# Patient Record
Sex: Male | Born: 1985 | Race: White | Hispanic: No | Smoking: Current every day smoker
Health system: Southern US, Community
[De-identification: ages and names within clinical notes are randomized; demographics above are authoritative.]

## PROBLEM LIST (undated history)

## (undated) DIAGNOSIS — I10 Essential (primary) hypertension: Secondary | ICD-10-CM

## (undated) DIAGNOSIS — F209 Schizophrenia, unspecified: Secondary | ICD-10-CM

## (undated) HISTORY — PX: PARTIAL COLECTOMY: SHX5273

## (undated) HISTORY — PX: OTHER SURGICAL HISTORY: SHX169

## (undated) HISTORY — PX: KNEE SURGERY: SHX244

---

## 2006-08-04 ENCOUNTER — Emergency Department (HOSPITAL_COMMUNITY): Admission: EM | Admit: 2006-08-04 | Discharge: 2006-08-04 | Payer: Self-pay | Admitting: Emergency Medicine

## 2007-02-11 ENCOUNTER — Emergency Department (HOSPITAL_COMMUNITY): Admission: EM | Admit: 2007-02-11 | Discharge: 2007-02-12 | Payer: Self-pay | Admitting: Emergency Medicine

## 2007-03-03 ENCOUNTER — Emergency Department (HOSPITAL_COMMUNITY): Admission: EM | Admit: 2007-03-03 | Discharge: 2007-03-03 | Payer: Self-pay | Admitting: Emergency Medicine

## 2010-09-11 ENCOUNTER — Emergency Department (HOSPITAL_COMMUNITY)
Admission: EM | Admit: 2010-09-11 | Discharge: 2010-09-12 | Discharge status: Against Medical Advice | Payer: Self-pay | Attending: Emergency Medicine | Admitting: Emergency Medicine

## 2010-09-11 ENCOUNTER — Emergency Department (HOSPITAL_COMMUNITY): Payer: Self-pay

## 2010-09-11 DIAGNOSIS — R042 Hemoptysis: Secondary | ICD-10-CM | POA: Insufficient documentation

## 2010-09-11 DIAGNOSIS — R0989 Other specified symptoms and signs involving the circulatory and respiratory systems: Secondary | ICD-10-CM | POA: Insufficient documentation

## 2010-09-11 DIAGNOSIS — R059 Cough, unspecified: Secondary | ICD-10-CM | POA: Insufficient documentation

## 2010-09-11 DIAGNOSIS — R079 Chest pain, unspecified: Secondary | ICD-10-CM | POA: Insufficient documentation

## 2010-09-11 DIAGNOSIS — R0609 Other forms of dyspnea: Secondary | ICD-10-CM | POA: Insufficient documentation

## 2010-09-11 DIAGNOSIS — R1110 Vomiting, unspecified: Secondary | ICD-10-CM | POA: Insufficient documentation

## 2010-09-11 DIAGNOSIS — R05 Cough: Secondary | ICD-10-CM | POA: Insufficient documentation

## 2010-09-11 LAB — CBC
Hemoglobin: 17 g/dL (ref 13.0–17.0)
MCH: 32.8 pg (ref 26.0–34.0)
MCV: 92.1 fL (ref 78.0–100.0)
RBC: 5.19 MIL/uL (ref 4.22–5.81)

## 2010-09-11 LAB — DIFFERENTIAL
Lymphs Abs: 3.2 10*3/uL (ref 0.7–4.0)
Monocytes Relative: 9 % (ref 3–12)
Neutro Abs: 4.6 10*3/uL (ref 1.7–7.7)
Neutrophils Relative %: 52 % (ref 43–77)

## 2010-09-11 LAB — URINALYSIS, ROUTINE W REFLEX MICROSCOPIC
Ketones, ur: 15 mg/dL — AB
Urine Glucose, Fasting: NEGATIVE mg/dL
pH: 5.5 (ref 5.0–8.0)

## 2010-09-11 LAB — BASIC METABOLIC PANEL
BUN: 6 mg/dL (ref 6–23)
Chloride: 105 mEq/L (ref 96–112)
Potassium: 3.8 mEq/L (ref 3.5–5.1)
Sodium: 139 mEq/L (ref 135–145)

## 2010-09-12 ENCOUNTER — Emergency Department (HOSPITAL_COMMUNITY): Payer: Self-pay

## 2011-04-25 LAB — URINALYSIS, ROUTINE W REFLEX MICROSCOPIC
Glucose, UA: NEGATIVE
Ketones, ur: NEGATIVE
Specific Gravity, Urine: 1.02
pH: 6.5

## 2011-04-25 LAB — CBC
HCT: 47.5
Hemoglobin: 16.3
MCV: 87.6
RBC: 5.42
WBC: 8.2

## 2011-04-25 LAB — DIFFERENTIAL
Eosinophils Absolute: 0.1
Eosinophils Relative: 1
Lymphocytes Relative: 20
Lymphs Abs: 1.6
Monocytes Relative: 12 — ABNORMAL HIGH

## 2011-04-25 LAB — BASIC METABOLIC PANEL
Chloride: 103
GFR calc Af Amer: >60
GFR calc non Af Amer: >60
Potassium: 3.8
Sodium: 136

## 2011-04-28 LAB — URINALYSIS, ROUTINE W REFLEX MICROSCOPIC
Glucose, UA: NEGATIVE
Hgb urine dipstick: NEGATIVE
Ketones, ur: NEGATIVE
Protein, ur: NEGATIVE
Urobilinogen, UA: 0.2

## 2011-04-28 LAB — CBC
HCT: 46.7
MCHC: 34.3
MCV: 87.1
Platelets: 246

## 2011-04-28 LAB — DIFFERENTIAL
Basophils Relative: 0
Eosinophils Absolute: 0.2
Eosinophils Relative: 3
Lymphs Abs: 2.2
Monocytes Relative: 7
Neutrophils Relative %: 64

## 2011-04-28 LAB — RAPID URINE DRUG SCREEN, HOSP PERFORMED
Amphetamines: NOT DETECTED
Barbiturates: NOT DETECTED
Benzodiazepines: NOT DETECTED

## 2011-08-02 ENCOUNTER — Emergency Department (HOSPITAL_COMMUNITY)
Admission: EM | Admit: 2011-08-02 | Discharge: 2011-08-03 | Disposition: A | Discharge status: Home / Self Care | Payer: Self-pay | Attending: Emergency Medicine | Admitting: Emergency Medicine

## 2011-08-02 ENCOUNTER — Encounter (HOSPITAL_COMMUNITY): Payer: Self-pay

## 2011-08-02 ENCOUNTER — Emergency Department (HOSPITAL_COMMUNITY): Payer: Self-pay

## 2011-08-02 DIAGNOSIS — F17200 Nicotine dependence, unspecified, uncomplicated: Secondary | ICD-10-CM | POA: Insufficient documentation

## 2011-08-02 DIAGNOSIS — F1010 Alcohol abuse, uncomplicated: Secondary | ICD-10-CM | POA: Insufficient documentation

## 2011-08-02 DIAGNOSIS — K2921 Alcoholic gastritis with bleeding: Secondary | ICD-10-CM | POA: Insufficient documentation

## 2011-08-02 LAB — DIFFERENTIAL
Lymphocytes Relative: 39 % (ref 12–46)
Lymphs Abs: 3.7 10*3/uL (ref 0.7–4.0)
Monocytes Relative: 9 % (ref 3–12)
Neutro Abs: 4.6 10*3/uL (ref 1.7–7.7)
Neutrophils Relative %: 49 % (ref 43–77)

## 2011-08-02 LAB — CBC
HCT: 50.3 % (ref 39.0–52.0)
Hemoglobin: 17.4 g/dL — ABNORMAL HIGH (ref 13.0–17.0)
MCH: 31.8 pg (ref 26.0–34.0)
MCHC: 34.6 g/dL (ref 30.0–36.0)
MCV: 91.8 fL (ref 78.0–100.0)
Platelets: 258 K/uL (ref 150–400)
RBC: 5.48 MIL/uL (ref 4.22–5.81)
RDW: 13.4 % (ref 11.5–15.5)
WBC: 9.4 K/uL (ref 4.0–10.5)

## 2011-08-02 LAB — COMPREHENSIVE METABOLIC PANEL
ALT: 18 U/L (ref 0–53)
Alkaline Phosphatase: 77 U/L (ref 39–117)
BUN: 8 mg/dL (ref 6–23)
CO2: 21 mEq/L (ref 19–32)
Chloride: 103 mEq/L (ref 96–112)
GFR calc Af Amer: >90 mL/min (ref >90)
Glucose, Bld: 90 mg/dL (ref 70–99)
Potassium: 3.5 mEq/L (ref 3.5–5.1)
Sodium: 139 mEq/L (ref 135–145)
Total Bilirubin: 0.7 mg/dL (ref 0.3–1.2)
Total Protein: 8.1 g/dL (ref 6.0–8.3)

## 2011-08-02 LAB — TYPE AND SCREEN
ABO/RH(D): AB POS
Antibody Screen: NEGATIVE

## 2011-08-02 LAB — ETHANOL: Alcohol, Ethyl (B): 259 mg/dL — ABNORMAL HIGH (ref 0–11)

## 2011-08-02 MED ORDER — SODIUM CHLORIDE 0.9 % IV SOLN: Freq: Once | INTRAVENOUS | Status: DC

## 2011-08-02 MED ORDER — ONDANSETRON HCL 4 MG/2ML IJ SOLN
4.0000 mg | Freq: Once | INTRAMUSCULAR | Status: AC
  Administered 2011-08-02: 4 mg via INTRAVENOUS
  Filled 2011-08-02: qty 2

## 2011-08-02 MED ORDER — PANTOPRAZOLE SODIUM 40 MG IV SOLR
40.0000 mg | Freq: Once | INTRAVENOUS | Status: AC
  Administered 2011-08-02: 40 mg via INTRAVENOUS
  Filled 2011-08-02: qty 40

## 2011-08-02 NOTE — ED Notes (Signed)
Pt brought in by family for vomiting blood. Per cousin pt has been vomiting x 3 days. Pt has ETOH on board. Per family pt drank approx 18 pack.

## 2011-08-02 NOTE — ED Provider Notes (Signed)
History     CSN: [id number]  Arrival date & time 08/02/11  2100   First MD Initiated Contact with Patient 08/02/11 2217      Chief Complaint  Patient presents with  . Hematemesis    (Consider location/radiation/quality/duration/timing/severity/associated sxs/prior treatment) HPI Comments: Pt has been having mid abdominal pain ~ 3 weeks.  He has had worsening pain and vomiting bright red blood starting 3 days ago.  He describes it currently as brown "coffee-ground" like.  He admits to drinking about 40-50 beers daily and a quart or two of moonshine.  Denies melena or hematochezia.  One similar episode about 3-4 years ago.  Has no PCP and has never been evaluated by a gastroenterologist.  The history is provided by the patient. No language interpreter was used.    History reviewed. No pertinent past medical history.  Past Surgical History  Procedure Date  . Knee surgery     No family history on file.  History  Substance Use Topics  . Smoking status: Current Everyday Smoker -- 2.0 packs/day  . Smokeless tobacco: Not on file  . Alcohol Use: 0.0 oz/week    30-40 Cans of beer per week      Review of Systems  Constitutional: Negative for fever and chills.  Gastrointestinal: Positive for nausea, vomiting and abdominal pain. Negative for diarrhea, blood in stool and anal bleeding.       Hematemesis  All other systems reviewed and are negative.    Allergies  Penicillins  Home Medications  No current outpatient prescriptions on file.  BP 123/69  Pulse 99  Temp(Src) 97.7 F (36.5 C) (Oral)  Resp 20  Ht 6\' 1"  (1.854 m)  Wt 275 lb (124.739 kg)  BMI 36.28 kg/m2  SpO2 97%  Physical Exam  Nursing note and vitals reviewed. Constitutional: He is oriented to person, place, and time. He appears well-developed and well-nourished.  HENT:  Head: Normocephalic and atraumatic.  Nose: Nose normal.  Mouth/Throat: Oropharynx is clear and moist. No oropharyngeal exudate.    Eyes: EOM are normal.  Neck: Normal range of motion.  Cardiovascular: Normal rate, regular rhythm, normal heart sounds and intact distal pulses.   Pulmonary/Chest: Effort normal and breath sounds normal. No respiratory distress. He has no wheezes. He has no rales. He exhibits no tenderness.  Abdominal: Soft. Bowel sounds are normal. He exhibits no distension and no mass. There is no hepatosplenomegaly. There is no tenderness. There is no rigidity, no rebound, no guarding, no CVA tenderness, no tenderness at McBurney's point and negative Murphy's sign.         Rectal exam revealed minimal stool but it was guiac positive.  Musculoskeletal: Normal range of motion.  Neurological: He is alert and oriented to person, place, and time.  Skin: Skin is warm and dry.  Psychiatric: He has a normal mood and affect. Judgment normal.    ED Course  Procedures (including critical care time)   Labs Reviewed  CBC  DIFFERENTIAL  COMPREHENSIVE METABOLIC PANEL  ETHANOL  LIPASE, BLOOD  POCT OCCULT BLOOD STOOL, DEVICE  TYPE AND SCREEN   No results found.   No diagnosis found.    MDM  Spoke with the pt about his labwork and x-rays.  He does not want to go into rehab.  He initially agreed to hospitalization but wanted a guarantee that he would be "fixed" by Monday morning at 6:00 when he has to go to work.  He does not want to stop drinking and  he just wants me to make him stop hurting.       Worthy Rancher, PA 08/03/11 0003  Worthy Rancher, PA 08/03/11 [phone]

## 2011-08-03 MED ORDER — ONDANSETRON HCL 4 MG/2ML IJ SOLN
4.0000 mg | Freq: Once | INTRAMUSCULAR | Status: AC
  Administered 2011-08-03: 4 mg via INTRAVENOUS
  Filled 2011-08-03: qty 2

## 2011-08-03 MED ORDER — FAMOTIDINE 20 MG PO TABS
20.0000 mg | ORAL_TABLET | Freq: Once | ORAL | Status: AC
Start: 2011-08-03 — End: 2011-08-03
  Administered 2011-08-03: 20 mg via ORAL
  Filled 2011-08-03: qty 1

## 2011-08-03 MED ORDER — ALBUTEROL SULFATE HFA 108 (90 BASE) MCG/ACT IN AERS
2.0000 | INHALATION_SPRAY | RESPIRATORY_TRACT | Status: DC | PRN
  Administered 2011-08-03: 2 via RESPIRATORY_TRACT
  Filled 2011-08-03: qty 6.7

## 2011-08-03 MED ORDER — PANTOPRAZOLE SODIUM 20 MG PO TBEC: 40.0000 mg | DELAYED_RELEASE_TABLET | Freq: Every day | ORAL | Status: DC

## 2011-08-04 NOTE — ED Provider Notes (Signed)
Medical screening examination/treatment/procedure(s) were performed by non-physician practitioner and as supervising physician I was immediately available for consultation/collaboration.   Meagon Duskin L Jaquanna Ballentine, MD 08/04/11 1134 

## 2011-10-13 ENCOUNTER — Emergency Department (HOSPITAL_COMMUNITY): Payer: Self-pay

## 2011-10-13 ENCOUNTER — Emergency Department (HOSPITAL_COMMUNITY)
Admission: EM | Admit: 2011-10-13 | Discharge: 2011-10-14 | Disposition: A | Discharge status: Home / Self Care | Payer: Self-pay | Attending: Emergency Medicine | Admitting: Emergency Medicine

## 2011-10-13 ENCOUNTER — Other Ambulatory Visit: Payer: Self-pay

## 2011-10-13 ENCOUNTER — Encounter (HOSPITAL_COMMUNITY): Payer: Self-pay | Admitting: Emergency Medicine

## 2011-10-13 DIAGNOSIS — Z8659 Personal history of other mental and behavioral disorders: Secondary | ICD-10-CM | POA: Insufficient documentation

## 2011-10-13 DIAGNOSIS — W208XXA Other cause of strike by thrown, projected or falling object, initial encounter: Secondary | ICD-10-CM | POA: Insufficient documentation

## 2011-10-13 DIAGNOSIS — R079 Chest pain, unspecified: Secondary | ICD-10-CM | POA: Insufficient documentation

## 2011-10-13 DIAGNOSIS — R109 Unspecified abdominal pain: Secondary | ICD-10-CM | POA: Insufficient documentation

## 2011-10-13 DIAGNOSIS — M25559 Pain in unspecified hip: Secondary | ICD-10-CM | POA: Insufficient documentation

## 2011-10-13 DIAGNOSIS — S20219A Contusion of unspecified front wall of thorax, initial encounter: Secondary | ICD-10-CM | POA: Insufficient documentation

## 2011-10-13 DIAGNOSIS — I10 Essential (primary) hypertension: Secondary | ICD-10-CM | POA: Insufficient documentation

## 2011-10-13 DIAGNOSIS — E119 Type 2 diabetes mellitus without complications: Secondary | ICD-10-CM | POA: Insufficient documentation

## 2011-10-13 DIAGNOSIS — IMO0002 Reserved for concepts with insufficient information to code with codable children: Secondary | ICD-10-CM | POA: Insufficient documentation

## 2011-10-13 DIAGNOSIS — R51 Headache: Secondary | ICD-10-CM | POA: Insufficient documentation

## 2011-10-13 DIAGNOSIS — Z79899 Other long term (current) drug therapy: Secondary | ICD-10-CM | POA: Insufficient documentation

## 2011-10-13 HISTORY — DX: Schizophrenia, unspecified: F20.9

## 2011-10-13 HISTORY — DX: Essential (primary) hypertension: I10

## 2011-10-13 MED ORDER — IOHEXOL 300 MG/ML  SOLN
100.0000 mL | Freq: Once | INTRAMUSCULAR | Status: AC | PRN
  Administered 2011-10-13: 100 mL via INTRAVENOUS

## 2011-10-13 MED ORDER — MORPHINE SULFATE 4 MG/ML IJ SOLN
4.0000 mg | Freq: Once | INTRAMUSCULAR | Status: AC
  Administered 2011-10-13: 4 mg via INTRAVENOUS
  Filled 2011-10-13: qty 1

## 2011-10-13 NOTE — ED Notes (Signed)
Patient was working under car this evening, car fell off the jack, half the car landed on right hip, abraisons noted to chest and right hip.  Patient has full recall, no LOC of incident.  Patient was ambulatory on scene.
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
## 2011-10-14 ENCOUNTER — Emergency Department (HOSPITAL_COMMUNITY): Payer: Self-pay

## 2011-10-14 LAB — POCT I-STAT, CHEM 8
BUN: 5 mg/dL — ABNORMAL LOW (ref 6–23)
Calcium, Ion: 1.11 mmol/L — ABNORMAL LOW (ref 1.12–1.32)
Chloride: 108 mEq/L (ref 96–112)
Creatinine, Ser: 1 mg/dL (ref 0.50–1.35)
Glucose, Bld: 90 mg/dL (ref 70–99)
TCO2: 18 mmol/L (ref 0–100)

## 2011-10-14 MED ORDER — HYDROCODONE-ACETAMINOPHEN 5-325 MG PO TABS: 2.0000 | ORAL_TABLET | ORAL | Status: AC | PRN

## 2011-10-14 MED ORDER — IBUPROFEN 800 MG PO TABS: 800.0000 mg | ORAL_TABLET | Freq: Three times a day (TID) | ORAL | Status: AC
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
## 2011-10-15 ENCOUNTER — Encounter (HOSPITAL_COMMUNITY): Payer: Self-pay | Admitting: *Deleted

## 2011-10-15 ENCOUNTER — Emergency Department (HOSPITAL_COMMUNITY)
Admission: EM | Admit: 2011-10-15 | Discharge: 2011-10-16 | Disposition: A | Discharge status: Home / Self Care | Payer: Self-pay | Attending: Emergency Medicine | Admitting: Emergency Medicine

## 2011-10-15 DIAGNOSIS — F3289 Other specified depressive episodes: Secondary | ICD-10-CM | POA: Insufficient documentation

## 2011-10-15 DIAGNOSIS — F17200 Nicotine dependence, unspecified, uncomplicated: Secondary | ICD-10-CM | POA: Insufficient documentation

## 2011-10-15 DIAGNOSIS — Z79899 Other long term (current) drug therapy: Secondary | ICD-10-CM | POA: Insufficient documentation

## 2011-10-15 DIAGNOSIS — Z7982 Long term (current) use of aspirin: Secondary | ICD-10-CM | POA: Insufficient documentation

## 2011-10-15 DIAGNOSIS — F1010 Alcohol abuse, uncomplicated: Secondary | ICD-10-CM | POA: Insufficient documentation

## 2011-10-15 DIAGNOSIS — F329 Major depressive disorder, single episode, unspecified: Secondary | ICD-10-CM

## 2011-10-15 DIAGNOSIS — Z8659 Personal history of other mental and behavioral disorders: Secondary | ICD-10-CM | POA: Insufficient documentation

## 2011-10-15 DIAGNOSIS — R45851 Suicidal ideations: Secondary | ICD-10-CM | POA: Insufficient documentation

## 2011-10-15 DIAGNOSIS — I10 Essential (primary) hypertension: Secondary | ICD-10-CM | POA: Insufficient documentation

## 2011-10-15 LAB — RAPID URINE DRUG SCREEN, HOSP PERFORMED
Amphetamines: NOT DETECTED
Opiates: NOT DETECTED
Tetrahydrocannabinol: NOT DETECTED

## 2011-10-15 LAB — BASIC METABOLIC PANEL
CO2: 21 mEq/L (ref 19–32)
Calcium: 9.4 mg/dL (ref 8.4–10.5)
Creatinine, Ser: 0.84 mg/dL (ref 0.50–1.35)
GFR calc non Af Amer: >90 mL/min (ref >90)
Glucose, Bld: 102 mg/dL — ABNORMAL HIGH (ref 70–99)
Sodium: 139 mEq/L (ref 135–145)

## 2011-10-15 LAB — DIFFERENTIAL
Basophils Absolute: 0 10*3/uL (ref 0.0–0.1)
Eosinophils Absolute: 0.2 10*3/uL (ref 0.0–0.7)
Eosinophils Relative: 3 % (ref 0–5)
Lymphs Abs: 2.6 10*3/uL (ref 0.7–4.0)
Monocytes Absolute: 0.6 10*3/uL (ref 0.1–1.0)

## 2011-10-15 LAB — CBC
HCT: 48.8 % (ref 39.0–52.0)
MCH: 32 pg (ref 26.0–34.0)
MCV: 91.9 fL (ref 78.0–100.0)
Platelets: 206 10*3/uL (ref 150–400)
RDW: 13 % (ref 11.5–15.5)

## 2011-10-15 MED ORDER — IBUPROFEN 800 MG PO TABS
800.0000 mg | ORAL_TABLET | Freq: Three times a day (TID) | ORAL | Status: DC | PRN
  Administered 2011-10-15: 800 mg via ORAL
  Filled 2011-10-15: qty 1

## 2011-10-15 NOTE — ED Notes (Signed)
C/o hip pain-states a car fell on him 2 nights ago; medicated per prn orders.
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
## 2011-10-16 MED ORDER — QUETIAPINE FUMARATE 100 MG PO TABS
ORAL_TABLET | ORAL | Status: AC
  Filled 2011-10-16: qty 2

## 2011-10-16 MED ORDER — TRAZODONE HCL 50 MG PO TABS
150.0000 mg | ORAL_TABLET | Freq: Every day | ORAL | Status: DC
  Administered 2011-10-16: 150 mg via ORAL
  Filled 2011-10-16: qty 3

## 2011-10-16 MED ORDER — NICOTINE 21 MG/24HR TD PT24: 21.0000 mg | MEDICATED_PATCH | Freq: Every day | TRANSDERMAL | Status: DC

## 2011-10-16 MED ORDER — TRAZODONE HCL 50 MG PO TABS
ORAL_TABLET | ORAL | Status: AC
  Filled 2011-10-16: qty 3

## 2011-10-16 MED ORDER — QUETIAPINE FUMARATE 100 MG PO TABS
200.0000 mg | ORAL_TABLET | Freq: Every day | ORAL | Status: DC
  Administered 2011-10-16: 200 mg via ORAL
  Filled 2011-10-16: qty 2

## 2011-10-16 MED ORDER — ZOLPIDEM TARTRATE 5 MG PO TABS: 5.0000 mg | ORAL_TABLET | Freq: Every evening | ORAL | Status: DC | PRN

## 2011-10-16 MED ORDER — IBUPROFEN 400 MG PO TABS: 600.0000 mg | ORAL_TABLET | Freq: Three times a day (TID) | ORAL | Status: DC | PRN

## 2011-10-16 MED ORDER — LORAZEPAM 1 MG PO TABS: 1.0000 mg | ORAL_TABLET | Freq: Three times a day (TID) | ORAL | Status: DC | PRN

## 2011-10-16 MED ORDER — NICOTINE 21 MG/24HR TD PT24
MEDICATED_PATCH | TRANSDERMAL | Status: AC
  Filled 2011-10-16: qty 1

## 2011-10-16 MED ORDER — ALUM & MAG HYDROXIDE-SIMETH 200-200-20 MG/5ML PO SUSP: 30.0000 mL | ORAL | Status: DC | PRN

## 2011-10-16 MED ORDER — ONDANSETRON HCL 4 MG PO TABS: 4.0000 mg | ORAL_TABLET | Freq: Three times a day (TID) | ORAL | Status: DC | PRN

## 2011-10-16 MED ORDER — NICOTINE 21 MG/24HR TD PT24
21.0000 mg | MEDICATED_PATCH | Freq: Once | TRANSDERMAL | Status: DC
  Administered 2011-10-16: 21 mg via TRANSDERMAL

## 2011-10-16 MED ORDER — ACETAMINOPHEN 325 MG PO TABS: 650.0000 mg | ORAL_TABLET | ORAL | Status: DC | PRN

## 2011-10-16 NOTE — ED Notes (Signed)
Patient provided breakfast tray by ED tech.

## 2011-10-16 NOTE — ED Provider Notes (Signed)
Pt no longer suicidal.  He will follow up with out pt tx if needed  Benny Lennert, MD 10/16/11 [phone]
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
## 2011-10-16 NOTE — ED Notes (Signed)
Patient given belongings from belongings locker. Denies any items missing.

## 2011-10-16 NOTE — Discharge Instructions (Signed)
Follow [facility] with [facility] if needed

## 2011-10-16 NOTE — ED Notes (Signed)
Patient awaiting discharge. Belongings given back to patient and patient allowed to get dressed. Tommy with ACT at bedside.
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
## 2011-10-16 NOTE — ED Notes (Signed)
Patient with no complaints at this time. Respirations even and unlabored. Skin warm/dry. Discharge instructions reviewed with patient at this time. Patient given opportunity to voice concerns/ask questions. Patient discharged at this time and left Emergency Department with steady gait with [employer] officer. Officer to give patient a ride home.
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
## 2011-10-16 NOTE — ED Notes (Signed)
Patient lying on left side. Eyes closed, NAD noted. [employer] remains at bedside. Will continue to monitor patient.

## 2013-05-22 ENCOUNTER — Emergency Department (HOSPITAL_COMMUNITY)
Admission: EM | Admit: 2013-05-22 | Discharge: 2013-05-22 | Discharge status: Against Medical Advice | Payer: Self-pay | Attending: Emergency Medicine | Admitting: Emergency Medicine

## 2013-05-22 ENCOUNTER — Encounter (HOSPITAL_COMMUNITY): Payer: Self-pay | Admitting: Emergency Medicine

## 2013-05-22 ENCOUNTER — Emergency Department (HOSPITAL_COMMUNITY): Payer: Self-pay

## 2013-05-22 DIAGNOSIS — I10 Essential (primary) hypertension: Secondary | ICD-10-CM | POA: Insufficient documentation

## 2013-05-22 DIAGNOSIS — S40212A Abrasion of left shoulder, initial encounter: Secondary | ICD-10-CM

## 2013-05-22 DIAGNOSIS — S060X9A Concussion with loss of consciousness of unspecified duration, initial encounter: Secondary | ICD-10-CM | POA: Insufficient documentation

## 2013-05-22 DIAGNOSIS — F17200 Nicotine dependence, unspecified, uncomplicated: Secondary | ICD-10-CM | POA: Insufficient documentation

## 2013-05-22 DIAGNOSIS — Z880 Allergy status to penicillin: Secondary | ICD-10-CM | POA: Insufficient documentation

## 2013-05-22 DIAGNOSIS — IMO0002 Reserved for concepts with insufficient information to code with codable children: Secondary | ICD-10-CM | POA: Insufficient documentation

## 2013-05-22 DIAGNOSIS — Z79899 Other long term (current) drug therapy: Secondary | ICD-10-CM | POA: Insufficient documentation

## 2013-05-22 DIAGNOSIS — S0100XA Unspecified open wound of scalp, initial encounter: Secondary | ICD-10-CM | POA: Insufficient documentation

## 2013-05-22 DIAGNOSIS — S0990XA Unspecified injury of head, initial encounter: Secondary | ICD-10-CM

## 2013-05-22 DIAGNOSIS — T7421XA Adult sexual abuse, confirmed, initial encounter: Secondary | ICD-10-CM | POA: Insufficient documentation

## 2013-05-22 DIAGNOSIS — S0101XA Laceration without foreign body of scalp, initial encounter: Secondary | ICD-10-CM

## 2013-05-22 DIAGNOSIS — F209 Schizophrenia, unspecified: Secondary | ICD-10-CM | POA: Insufficient documentation

## 2013-05-22 MED ORDER — IBUPROFEN 200 MG PO TABS
600.0000 mg | ORAL_TABLET | Freq: Once | ORAL | Status: AC
  Administered 2013-05-22: 600 mg via ORAL
  Filled 2013-05-22: qty 3

## 2013-05-22 NOTE — ED Notes (Signed)
Pt refuses to stay any longer, MD aware. Pt signed out AMA, IV removed and pt escorted off property with security/[employer].

## 2013-05-22 NOTE — ED Notes (Signed)
Pt's grandmother called, no answer.

## 2013-05-22 NOTE — ED Notes (Signed)
Pt given a urinal to pee.

## 2013-05-22 NOTE — ED Notes (Signed)
Pt gave permission for this RN to call his wife Ava. Pt's wife informed of pt's situation and asked to pick him up. Pt's wife states that she will not be coming to pick him up. Pt's wife states the pt's grandmother is en route to the department.
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
## 2014-09-19 ENCOUNTER — Encounter (HOSPITAL_COMMUNITY): Payer: Self-pay

## 2014-09-19 ENCOUNTER — Emergency Department (HOSPITAL_COMMUNITY)
Admission: EM | Admit: 2014-09-19 | Discharge: 2014-09-19 | Disposition: A | Discharge status: Home / Self Care | Payer: Self-pay | Attending: Emergency Medicine | Admitting: Emergency Medicine

## 2014-09-19 ENCOUNTER — Emergency Department (HOSPITAL_COMMUNITY): Payer: Self-pay

## 2014-09-19 DIAGNOSIS — Z8659 Personal history of other mental and behavioral disorders: Secondary | ICD-10-CM | POA: Insufficient documentation

## 2014-09-19 DIAGNOSIS — I10 Essential (primary) hypertension: Secondary | ICD-10-CM | POA: Insufficient documentation

## 2014-09-19 DIAGNOSIS — Z79899 Other long term (current) drug therapy: Secondary | ICD-10-CM | POA: Insufficient documentation

## 2014-09-19 DIAGNOSIS — S161XXA Strain of muscle, fascia and tendon at neck level, initial encounter: Secondary | ICD-10-CM | POA: Insufficient documentation

## 2014-09-19 DIAGNOSIS — Z23 Encounter for immunization: Secondary | ICD-10-CM | POA: Insufficient documentation

## 2014-09-19 DIAGNOSIS — Y998 Other external cause status: Secondary | ICD-10-CM | POA: Insufficient documentation

## 2014-09-19 DIAGNOSIS — Y9389 Activity, other specified: Secondary | ICD-10-CM | POA: Insufficient documentation

## 2014-09-19 DIAGNOSIS — Y92410 Unspecified street and highway as the place of occurrence of the external cause: Secondary | ICD-10-CM | POA: Insufficient documentation

## 2014-09-19 DIAGNOSIS — Z7982 Long term (current) use of aspirin: Secondary | ICD-10-CM | POA: Insufficient documentation

## 2014-09-19 DIAGNOSIS — Z720 Tobacco use: Secondary | ICD-10-CM | POA: Insufficient documentation

## 2014-09-19 DIAGNOSIS — M25522 Pain in left elbow: Secondary | ICD-10-CM

## 2014-09-19 DIAGNOSIS — Z880 Allergy status to penicillin: Secondary | ICD-10-CM | POA: Insufficient documentation

## 2014-09-19 DIAGNOSIS — S59902A Unspecified injury of left elbow, initial encounter: Secondary | ICD-10-CM | POA: Insufficient documentation

## 2014-09-19 LAB — CBC WITH DIFFERENTIAL/PLATELET
Basophils Absolute: 0 10*3/uL (ref 0.0–0.1)
Basophils Relative: 0 % (ref 0–1)
EOS ABS: 0 10*3/uL (ref 0.0–0.7)
Eosinophils Relative: 0 % (ref 0–5)
HEMATOCRIT: 50.9 % (ref 39.0–52.0)
HEMOGLOBIN: 18.2 g/dL — AB (ref 13.0–17.0)
LYMPHS ABS: 2.4 10*3/uL (ref 0.7–4.0)
Lymphocytes Relative: 14 % (ref 12–46)
MCH: 33.3 pg (ref 26.0–34.0)
MCHC: 35.8 g/dL (ref 30.0–36.0)
MCV: 93.2 fL (ref 78.0–100.0)
MONOS PCT: 5 % (ref 3–12)
Monocytes Absolute: 0.8 10*3/uL (ref 0.1–1.0)
NEUTROS PCT: 81 % — AB (ref 43–77)
Neutro Abs: 13.7 10*3/uL — ABNORMAL HIGH (ref 1.7–7.7)
Platelets: 225 10*3/uL (ref 150–400)
RBC: 5.46 MIL/uL (ref 4.22–5.81)
RDW: 13.1 % (ref 11.5–15.5)
WBC: 17 10*3/uL — AB (ref 4.0–10.5)

## 2014-09-19 LAB — COMPREHENSIVE METABOLIC PANEL
ALT: 40 U/L (ref 0–53)
ANION GAP: 12 (ref 5–15)
AST: 37 U/L (ref 0–37)
Albumin: 4.5 g/dL (ref 3.5–5.2)
Alkaline Phosphatase: 68 U/L (ref 39–117)
BUN: 9 mg/dL (ref 6–23)
CALCIUM: 8.9 mg/dL (ref 8.4–10.5)
CO2: 18 mmol/L — ABNORMAL LOW (ref 19–32)
Chloride: 107 mmol/L (ref 96–112)
Creatinine, Ser: 0.82 mg/dL (ref 0.50–1.35)
GLUCOSE: 100 mg/dL — AB (ref 70–99)
Potassium: 3.6 mmol/L (ref 3.5–5.1)
SODIUM: 137 mmol/L (ref 135–145)
Total Bilirubin: 1.3 mg/dL — ABNORMAL HIGH (ref 0.3–1.2)
Total Protein: 8.3 g/dL (ref 6.0–8.3)

## 2014-09-19 LAB — ETHANOL: Alcohol, Ethyl (B): 211 mg/dL — ABNORMAL HIGH (ref 0–9)

## 2014-09-19 MED ORDER — TETANUS-DIPHTH-ACELL PERTUSSIS 5-2.5-18.5 LF-MCG/0.5 IM SUSP
0.5000 mL | Freq: Once | INTRAMUSCULAR | Status: AC
  Administered 2014-09-19: 0.5 mL via INTRAMUSCULAR
  Filled 2014-09-19: qty 0.5

## 2014-09-19 MED ORDER — ACETAMINOPHEN 500 MG PO TABS
1000.0000 mg | ORAL_TABLET | Freq: Once | ORAL | Status: AC
  Administered 2014-09-19: 1000 mg via ORAL
  Filled 2014-09-19: qty 2

## 2014-09-19 NOTE — Discharge Instructions (Signed)
Cervical Sprain A cervical sprain is when the tissues (ligaments) that hold the neck bones in place stretch or tear. HOME CARE   Put ice on the injured area.  Put ice in a plastic bag.  Place a towel between your skin and the bag.  Leave the ice on for 15-20 minutes, 3-4 times a day.  You may have been given a collar to wear. This collar keeps your neck from moving while you heal.  Do not take the collar off unless told by your doctor.  If you have long hair, keep it outside of the collar.  Ask your doctor before changing the position of your collar. You may need to change its position over time to make it more comfortable.  If you are allowed to take off the collar for cleaning or bathing, follow your doctor's instructions on how to do it safely.  Keep your collar clean by wiping it with mild soap and water. Dry it completely. If the collar has removable pads, remove them every 1-2 days to hand wash them with soap and water. Allow them to air dry. They should be dry before you wear them in the collar.  Do not drive while wearing the collar.  Only take medicine as told by your doctor.  Keep all doctor visits as told.  Keep all physical therapy visits as told.  Adjust your work station so that you have good posture while you work.  Avoid positions and activities that make your problems worse.  Warm up and stretch before being active. GET HELP IF:  Your pain is not controlled with medicine.  You cannot take less pain medicine over time as planned.  Your activity level does not improve as expected. GET HELP RIGHT AWAY IF:   You are bleeding.  Your stomach is upset.  You have an allergic reaction to your medicine.  You develop new problems that you cannot explain.  You lose feeling (become numb) or you cannot move any part of your body (paralysis).  You have tingling or weakness in any part of your body.  Your symptoms get worse. Symptoms include:  Pain,  soreness, stiffness, puffiness (swelling), or a burning feeling in your neck.  Pain when your neck is touched.  Shoulder or upper back pain.  Limited ability to move your neck.  Headache.  Dizziness.  Your hands or arms feel week, lose feeling, or tingle.  Muscle spasms.  Difficulty swallowing or chewing. MAKE SURE YOU:   Understand these instructions.  Will watch your condition.  Will get help right away if you are not doing well or get worse. Document Released: 12/17/2007 Document Revised: 03/02/2013 Document Reviewed: 01/05/2013 Exit[employer] Patient Information 2015 [employer]. This information is not intended to replace advice given to you by your health care provider. Make sure you discuss any questions you have with your health care provider.  

## 2014-09-19 NOTE — ED Notes (Signed)
Pt requesting Percocet and angry he is not being given pain medications.  Reinforced with pt that he was already given Tylenol and administering additional narcotics would not be prudent given his current level of blood alcohol. Pt upset at discharge. Discharged in custody of Doctor, hospitalhigh[facility] patrol officer.
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
## 2017-10-20 ENCOUNTER — Other Ambulatory Visit: Payer: Self-pay

## 2017-10-20 ENCOUNTER — Encounter (HOSPITAL_COMMUNITY): Payer: Self-pay | Admitting: *Deleted

## 2017-10-20 ENCOUNTER — Emergency Department (HOSPITAL_COMMUNITY)
Admission: EM | Admit: 2017-10-20 | Discharge: 2017-10-20 | Discharge status: Against Medical Advice | Payer: Self-pay | Attending: Emergency Medicine | Admitting: Emergency Medicine

## 2017-10-20 DIAGNOSIS — Z5320 Procedure and treatment not carried out because of patient's decision for unspecified reasons: Secondary | ICD-10-CM | POA: Insufficient documentation

## 2017-10-20 DIAGNOSIS — K921 Melena: Secondary | ICD-10-CM | POA: Insufficient documentation

## 2017-10-20 DIAGNOSIS — F10288 Alcohol dependence with other alcohol-induced disorder: Secondary | ICD-10-CM | POA: Insufficient documentation

## 2017-10-20 DIAGNOSIS — K922 Gastrointestinal hemorrhage, unspecified: Secondary | ICD-10-CM | POA: Insufficient documentation

## 2017-10-20 DIAGNOSIS — F17210 Nicotine dependence, cigarettes, uncomplicated: Secondary | ICD-10-CM | POA: Insufficient documentation

## 2017-10-20 DIAGNOSIS — I10 Essential (primary) hypertension: Secondary | ICD-10-CM | POA: Insufficient documentation

## 2017-10-20 LAB — LIPASE, BLOOD: LIPASE: 23 U/L (ref 11–51)

## 2017-10-20 LAB — COMPREHENSIVE METABOLIC PANEL
ALBUMIN: 4.1 g/dL (ref 3.5–5.0)
ALT: 23 U/L (ref 17–63)
AST: 20 U/L (ref 15–41)
Alkaline Phosphatase: 59 U/L (ref 38–126)
Anion gap: 14 (ref 5–15)
BUN: 5 mg/dL — ABNORMAL LOW (ref 6–20)
CHLORIDE: 105 mmol/L (ref 101–111)
CO2: 23 mmol/L (ref 22–32)
CREATININE: 0.83 mg/dL (ref 0.61–1.24)
Calcium: 9 mg/dL (ref 8.9–10.3)
GFR calc Af Amer: >60 mL/min (ref >60)
GFR calc non Af Amer: >60 mL/min (ref >60)
GLUCOSE: 97 mg/dL (ref 65–99)
POTASSIUM: 3.6 mmol/L (ref 3.5–5.1)
SODIUM: 142 mmol/L (ref 135–145)
Total Bilirubin: 0.6 mg/dL (ref 0.3–1.2)
Total Protein: 7.8 g/dL (ref 6.5–8.1)

## 2017-10-20 LAB — CBC WITH DIFFERENTIAL/PLATELET
BASOS ABS: 0 10*3/uL (ref 0.0–0.1)
BASOS PCT: 0 %
Eosinophils Absolute: 0.1 10*3/uL (ref 0.0–0.7)
Eosinophils Relative: 1 %
HCT: 50.3 % (ref 39.0–52.0)
Hemoglobin: 16.7 g/dL (ref 13.0–17.0)
LYMPHS PCT: 39 %
Lymphs Abs: 3 10*3/uL (ref 0.7–4.0)
MCH: 30.9 pg (ref 26.0–34.0)
MCHC: 33.2 g/dL (ref 30.0–36.0)
MCV: 93.1 fL (ref 78.0–100.0)
Monocytes Absolute: 0.8 10*3/uL (ref 0.1–1.0)
Monocytes Relative: 10 %
Neutro Abs: 3.8 10*3/uL (ref 1.7–7.7)
Neutrophils Relative %: 50 %
Platelets: 218 10*3/uL (ref 150–400)
RBC: 5.4 MIL/uL (ref 4.22–5.81)
RDW: 14.6 % (ref 11.5–15.5)
WBC: 7.7 10*3/uL (ref 4.0–10.5)

## 2017-10-20 LAB — TYPE AND SCREEN
ABO/RH(D): AB POS
ANTIBODY SCREEN: NEGATIVE

## 2017-10-20 LAB — APTT: APTT: 26 s (ref 24–36)

## 2017-10-20 LAB — PROTIME-INR
INR: 0.98
Prothrombin Time: 12.9 seconds (ref 11.4–15.2)

## 2017-10-20 MED ORDER — OMEPRAZOLE 40 MG PO CPDR: 40.0000 mg | DELAYED_RELEASE_CAPSULE | Freq: Every day | ORAL | 0 refills | Status: AC

## 2017-10-20 MED ORDER — PANTOPRAZOLE SODIUM 40 MG IV SOLR
80.0000 mg | Freq: Once | INTRAVENOUS | Status: AC
  Administered 2017-10-20: 80 mg via INTRAVENOUS
  Filled 2017-10-20: qty 80

## 2017-10-20 MED ORDER — CHLORDIAZEPOXIDE HCL 25 MG PO CAPS: ORAL_CAPSULE | ORAL | 0 refills | Status: AC

## 2017-10-20 MED ORDER — SODIUM CHLORIDE 0.9 % IV SOLN
8.0000 mg/h | INTRAVENOUS | Status: DC
  Filled 2017-10-20 (×3): qty 80

## 2017-10-20 MED ORDER — SUCRALFATE 1 G PO TABS: 1.0000 g | ORAL_TABLET | Freq: Three times a day (TID) | ORAL | 0 refills | Status: AC

## 2017-10-20 MED ORDER — GI COCKTAIL ~~LOC~~
30.0000 mL | Freq: Once | ORAL | Status: AC
  Administered 2017-10-20: 30 mL via ORAL
  Filled 2017-10-20: qty 30

## 2017-10-20 MED ORDER — SODIUM CHLORIDE 0.9 % IV SOLN
50.0000 ug/h | INTRAVENOUS | Status: DC
  Filled 2017-10-20 (×3): qty 1

## 2017-10-20 MED ORDER — OCTREOTIDE LOAD VIA INFUSION
50.0000 ug | Freq: Once | INTRAVENOUS | Status: DC
  Filled 2017-10-20: qty 25
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
## 2017-10-20 NOTE — Discharge Instructions (Addendum)
We saw you in the ER for bloody vomit and dark tarry stools. Given that you have extensive alcohol use history we suspect that you are having gastritis or other concerning complications that can lead to heavy bleeding.  You have decided to leave AGAINST MEDICAL ADVICE at this time, while understanding the risks of your decision. Please start taking the medications prescribed for possible stomach ulcers.  See the GI doctor as requested.  Come back to the emergency room immediately if your symptoms get worse.  Refrain from drinking.  Start taking the Librium for your alcoholism.  Please see 1 of the outpatient resources provided for detox and rehab.  Substance Abuse Treatment Programs  Intensive Outpatient Programs [facility]     [address]      [place], [place]                   [phone]       [facility] [address] [place] [address] [place], [place] [phone]  [facility]     (Inpatient and outpatient)     [address]           [phone]    [facility] [phone] (Suboxone and Methadone)  [address]      [place], [place] [address]      [phone]       [address] [place], [place] [phone]  [facility] (Outpatient/Inpatient, Chemical)    (insurance only) [phone]             [facility] (Groups & Residential) [place], [place] [phone]     [facility]     [address]     [place], [place]      [phone]       [facility] (for caregivers and family) [phone] [address] [place], [place] [phone]      Residential Treatment Programs [facility]      [address], [place], [place] [address]  [phone]       [facility] [address], [place], [place] [address] [phone]  [facility] [place]        [phone]       [facility] [phone]  [facility] ([facility]               [address]                                         [place], [place]                                                [phone] or [phone]                               [facility] [place] [address] [place], [address] [phone]  [facility]    [address]      [place], [place]     [phone]       [facility] [address] [place], [place] [phone]  [facility]   [address] [place]  [place], [place] [address]     [phone]      Admissions: 8am-3pm M-F  [facility] ([facility]) [address] [place], [place] [phone]  [facility]: Residential Program ([address] Days)   [place], [place]      [phone] or [phone]     [facility]: [facility] [place], [place] (Walk in Hours over the weekend or by referral)  [facility] [address] [place], [place], [place] [address] [phone]  Crisis Mobile: [facility]:  [phone] (for crisis response 24 hours a day) [facility] Hotline:      [phone] Outpatient Psychiatry and Counseling  [facility]: Mobile Crisis Management 24 hours:  [phone]  [facility] sliding scale fee and walk in schedule: M-F 8am-12pm/1pm-3pm [address]  [place], [place] [address] [phone]  [facility] [address] [place], [place] [address] [phone]  [facility] ([facility] new patient walk-in appointments available Monday - Friday 8am -3pm.          [address] [place], [place] [address] [phone] or crisis line- [phone]  [facility]/ Intensive Outpatient Therapy Program [address] [place], [place] [address] [phone]  [facility]                  Crisis Services      [phone]      [address]     [place], [place] [address]                 [facility]   [facility] [phone] [address] [place], [place] [address]   [facility]          [address] [place],  [place], [place] [address]       [phone]  [facility] [address] [place], [place] [address] [phone]  [facility]    [address]    [place], [place] [address]     [phone]       Andee PolesParish McKinney, MD     [address]     [place] [address]     [phone]       [facility] [address] [place] [address]  [facility]     [address]     [place], [place]      [phone]       [facility] Eulogio DitchShamsher Ahluwalia, MD [address] [place], [place] [address] [phone]  [facility]     [address]     [place], [place] [address]     [phone]       [facility] [address] [place], [place] [address] [phone] Resources for Temporary Residential Assistance/Crisis Centers  [facility]) M-F 8am-3pm   [address] [place], [place] [address]   [phone] Services include: laundry, barbering, support groups, case management, phone  & computer access, showers, AA/NA mtgs, mental health/substance abuse nurse, job skills class, disability information, VA assistance, spiritual classes, etc.   HOMELESS SHELTERS  [facility] [facility]     [facility]   [address], [place]     [phone]              [facility] (  women and children)       [address] [place], [place] [address] [phone] [email or web address] for application and process Application Required  [facility]   [address]    [place] [address]     [phone]                     [facility] [address] [place], [place] [address] [phone] application appt.) Application Required  [facility] (women only)    [address]     [place], [place] [address]     [phone]      Intake starts 6pm daily Need valid ID, SSC, & Police report [employer] [address] [place], [place] [phone] Application Required  [employer] (men only)     [address]      [place], [place]     [phone]       [facility] [place] (Pregnant women only) [address] [place], [place] [phone]  [facility]      [address]      [place], [place] [address]     [phone]             [facility] [address] [place], [place] [phone] 90 day commitment/SA/Application process  [facility](men only)     [address]     [place], [place]     [phone]       Check-in at [facility] [address] [place], [place] [address] [phone] Men/Women/Women and Children must be there by 7 pm  [facility] [place], [place] [phone]

## 2017-10-20 NOTE — ED Triage Notes (Addendum)
Pt c/o vomiting up "coffee grounds and blood" and having "coffee ground" stools that started yesterday afternoon. Pt also reports LUQ pain. Denies weakness. Pt reports he is a daily drinker of approximately 12 pack daily. Pt reports last night he drank about 1/2 gallon of liquor.

## 2017-10-20 NOTE — ED Notes (Addendum)
Patient leaving AMA. States "I have to go to work. I have bills to pay and I have kids." No vomiting or diarrhea while patient in ER. Patient alert and oriented, ambulating with no assistance or difficulty. Patient refuses to stay and receive any more treatment. Signed AMA. Prescriptions and printed instructions given to patient as requested by ED physician.
